# Patient Record
Sex: Male | Born: 1975 | Race: Black or African American | Hispanic: No | Marital: Single | Smoking: Former smoker
Health system: Southern US, Community
[De-identification: ages and names within clinical notes are randomized; demographics above are authoritative.]

## PROBLEM LIST (undated history)

## (undated) DIAGNOSIS — F329 Major depressive disorder, single episode, unspecified: Secondary | ICD-10-CM

## (undated) DIAGNOSIS — F32A Depression, unspecified: Secondary | ICD-10-CM

## (undated) DIAGNOSIS — I10 Essential (primary) hypertension: Secondary | ICD-10-CM

## (undated) HISTORY — PX: ADENOIDECTOMY: SUR15

---

## 2008-07-19 ENCOUNTER — Emergency Department (HOSPITAL_COMMUNITY): Admission: EM | Admit: 2008-07-19 | Discharge: 2008-07-19 | Payer: Self-pay | Admitting: Family Medicine

## 2012-04-01 ENCOUNTER — Emergency Department (HOSPITAL_COMMUNITY)
Admission: EM | Admit: 2012-04-01 | Discharge: 2012-04-01 | Disposition: A | Discharge status: Home / Self Care | Payer: Self-pay | Attending: Emergency Medicine | Admitting: Emergency Medicine

## 2012-04-01 ENCOUNTER — Encounter (HOSPITAL_COMMUNITY): Payer: Self-pay | Admitting: Emergency Medicine

## 2012-04-01 DIAGNOSIS — Z880 Allergy status to penicillin: Secondary | ICD-10-CM | POA: Insufficient documentation

## 2012-04-01 DIAGNOSIS — J329 Chronic sinusitis, unspecified: Secondary | ICD-10-CM | POA: Insufficient documentation

## 2012-04-01 DIAGNOSIS — Z91013 Allergy to seafood: Secondary | ICD-10-CM | POA: Insufficient documentation

## 2012-04-01 DIAGNOSIS — F17200 Nicotine dependence, unspecified, uncomplicated: Secondary | ICD-10-CM | POA: Insufficient documentation

## 2012-04-01 DIAGNOSIS — I10 Essential (primary) hypertension: Secondary | ICD-10-CM | POA: Insufficient documentation

## 2012-04-01 HISTORY — DX: Essential (primary) hypertension: I10

## 2012-04-01 MED ORDER — PREDNISONE 10 MG PO TABS: 20.0000 mg | ORAL_TABLET | Freq: Every day | ORAL | Status: DC

## 2012-04-01 MED ORDER — SULFAMETHOXAZOLE-TRIMETHOPRIM 800-160 MG PO TABS: 1.0000 | ORAL_TABLET | Freq: Two times a day (BID) | ORAL | Status: DC

## 2012-04-01 NOTE — ED Provider Notes (Signed)
History  Scribed for Tyler Lennert, MD, the patient was seen in room TR11C/TR11C. This chart was scribed by Candelaria Stagers. The patient's care started at 5:30 PM   CSN: [id number]  Arrival date & time 04/01/12  1547   First MD Initiated Contact with Patient 04/01/12 1727      Chief Complaint  Patient presents with  . Sinusitis     Patient is a 36 y.o. male presenting with sinusitis. The history is provided by the patient. No language interpreter was used.  Sinusitis  This is a recurrent problem. The current episode started more than 2 days ago. The problem has not changed since onset.There has been no fever. The pain is mild. Associated symptoms include congestion and sinus pressure. Pertinent negatives include no cough. He has tried nothing for the symptoms. The treatment provided no relief.   [name] is a 36 y.o. male who presents to the Emergency Department complaining of sinus pain and congestion that started several days ago.  He is also experiencing drainage and chills.  He denies cough.  Nothing seems to make the sx better or worse.   Past Medical History  Diagnosis Date  . Hypertension     monitoring- no meds    Past Surgical History  Procedure Date  . Adenoidectomy     History reviewed. No pertinent family history.  History  Substance Use Topics  . Smoking status: Current Every Day Smoker -- 0.2 packs/day    Types: Cigarettes  . Smokeless tobacco: Not on file  . Alcohol Use: Yes     occasion      Review of Systems  Constitutional: Negative for fatigue.  HENT: Positive for congestion and sinus pressure. Negative for ear discharge.   Eyes: Negative for discharge.  Respiratory: Negative for cough.   Cardiovascular: Negative for chest pain.  Gastrointestinal: Negative for abdominal pain and diarrhea.  Genitourinary: Negative for frequency and hematuria.  Musculoskeletal: Negative for back pain.  Skin: Negative for rash.  Neurological: Negative for  seizures and headaches.  Hematological: Negative.   Psychiatric/Behavioral: Negative for hallucinations.    Allergies  Penicillins and Shellfish allergy  Home Medications   Current Outpatient Rx  Name Route Sig Dispense Refill  . ALBUTEROL SULFATE HFA 108 (90 BASE) MCG/ACT IN AERS Inhalation Inhale 2 puffs into the lungs every 6 (six) hours as needed. For wheezing    . ALPRAZOLAM 0.25 MG PO TABS Oral Take 0.25 mg by mouth daily as needed. For anxiety    . ADULT MULTIVITAMIN W/MINERALS CH Oral Take 1 tablet by mouth daily.    . TETRAHYDROZOLINE HCL 0.05 % OP SOLN Both Eyes Place 1 drop into both eyes daily as needed. For dry eyes      BP 102/62  Pulse 98  Temp 98.4 F (36.9 C) (Oral)  Resp 18  SpO2 96%  Physical Exam  Constitutional: He is oriented to person, place, and time. He appears well-developed.  HENT:  Head: Normocephalic.       Minor left side maxillary tenderness  Eyes: Conjunctivae normal are normal.  Neck: No tracheal deviation present.  Cardiovascular:  No murmur heard. Musculoskeletal: Normal range of motion.  Neurological: He is oriented to person, place, and time.  Skin: Skin is warm.  Psychiatric: He has a normal mood and affect.    ED Course  Procedures   DIAGNOSTIC STUDIES: Oxygen Saturation is 96% on room air, normal by my interpretation.    COORDINATION OF CARE:  Labs Reviewed - No data to display No results found.   No diagnosis found.    MDM  The chart was scribed for me under my direct supervision.  I personally performed the history, physical, and medical decision making and all procedures in the evaluation of this patient.Tyler Lennert, MD 04/01/12 1739

## 2012-04-01 NOTE — ED Notes (Addendum)
Reports having sinus infection X 1 week; also would like to get checked for STD d/t g/f was dx recently with thinks "BV"

## 2013-04-07 ENCOUNTER — Emergency Department (HOSPITAL_COMMUNITY)
Admission: EM | Admit: 2013-04-07 | Discharge: 2013-04-07 | Disposition: A | Discharge status: Home / Self Care | Payer: Self-pay | Attending: Emergency Medicine | Admitting: Emergency Medicine

## 2013-04-07 ENCOUNTER — Encounter (HOSPITAL_COMMUNITY): Payer: Self-pay | Admitting: Emergency Medicine

## 2013-04-07 ENCOUNTER — Emergency Department (HOSPITAL_COMMUNITY): Payer: Self-pay

## 2013-04-07 DIAGNOSIS — Z79899 Other long term (current) drug therapy: Secondary | ICD-10-CM | POA: Insufficient documentation

## 2013-04-07 DIAGNOSIS — F329 Major depressive disorder, single episode, unspecified: Secondary | ICD-10-CM | POA: Insufficient documentation

## 2013-04-07 DIAGNOSIS — Z87891 Personal history of nicotine dependence: Secondary | ICD-10-CM | POA: Insufficient documentation

## 2013-04-07 DIAGNOSIS — I10 Essential (primary) hypertension: Secondary | ICD-10-CM | POA: Insufficient documentation

## 2013-04-07 DIAGNOSIS — J40 Bronchitis, not specified as acute or chronic: Secondary | ICD-10-CM

## 2013-04-07 DIAGNOSIS — J209 Acute bronchitis, unspecified: Secondary | ICD-10-CM | POA: Insufficient documentation

## 2013-04-07 DIAGNOSIS — J3489 Other specified disorders of nose and nasal sinuses: Secondary | ICD-10-CM | POA: Insufficient documentation

## 2013-04-07 DIAGNOSIS — F3289 Other specified depressive episodes: Secondary | ICD-10-CM | POA: Insufficient documentation

## 2013-04-07 DIAGNOSIS — Z880 Allergy status to penicillin: Secondary | ICD-10-CM | POA: Insufficient documentation

## 2013-04-07 HISTORY — DX: Major depressive disorder, single episode, unspecified: F32.9

## 2013-04-07 HISTORY — DX: Depression, unspecified: F32.A

## 2013-04-07 MED ORDER — PREDNISONE 20 MG PO TABS: 20.0000 mg | ORAL_TABLET | Freq: Two times a day (BID) | ORAL | Status: DC

## 2013-04-07 MED ORDER — AEROCHAMBER PLUS W/MASK MISC: Status: DC

## 2013-04-07 MED ORDER — ALBUTEROL SULFATE (5 MG/ML) 0.5% IN NEBU
5.0000 mg | INHALATION_SOLUTION | Freq: Once | RESPIRATORY_TRACT | Status: AC
  Administered 2013-04-07: 5 mg via RESPIRATORY_TRACT
  Filled 2013-04-07: qty 1

## 2013-04-07 MED ORDER — ALBUTEROL SULFATE HFA 108 (90 BASE) MCG/ACT IN AERS: 2.0000 | INHALATION_SPRAY | RESPIRATORY_TRACT | Status: AC | PRN

## 2013-04-07 MED ORDER — IPRATROPIUM BROMIDE 0.02 % IN SOLN
0.5000 mg | Freq: Once | RESPIRATORY_TRACT | Status: AC
  Administered 2013-04-07: 0.5 mg via RESPIRATORY_TRACT
  Filled 2013-04-07: qty 2.5

## 2013-04-07 NOTE — ED Notes (Signed)
Also reports sinus pain.

## 2013-04-07 NOTE — ED Notes (Signed)
Pt reporting central CP for 2 days. Has cough today and nasal congestion for 4 days. Reports coughing up gray mucus. States SOB. Skin is warm and dry. Pt is a x 4. In NAD. No cardiac hx.

## 2013-04-07 NOTE — ED Provider Notes (Signed)
CSN: [id number]     Arrival date & time 04/07/13  7829 History   None    Chief Complaint  Patient presents with  . Chest Pain   (Consider location/radiation/quality/duration/timing/severity/associated sxs/prior Treatment) HPI Comments: Tyler Bishop is a 37 y.o. male who complains of intermittent sharp left anterior chest pain, for 2 days, associated with chest congestion, and nasal congestion. He denies fever, chills, nausea, vomiting, weakness, dizziness, diaphoresis, persistent, chest pain, back pain, or near syncope. He stopped smoking one and half weeks ago. He has a mild, nonproductive cough at this time. He has a history of asthma. He tried using his daughter's nebulizer and it helped. There are no other known modifying factors.  The history is provided by the patient.    Past Medical History  Diagnosis Date  . Hypertension     monitoring- no meds  . Depression    Past Surgical History  Procedure Laterality Date  . Adenoidectomy     No family history on file. History  Substance Use Topics  . Smoking status: Former Smoker -- 0.20 packs/day    Types: Cigarettes  . Smokeless tobacco: Not on file  . Alcohol Use: No     Comment: occasion    Review of Systems  All other systems reviewed and are negative.    Allergies  Penicillins and Shellfish allergy  Home Medications   Current Outpatient Rx  Name  Route  Sig  Dispense  Refill  . ALPRAZolam (XANAX) 0.25 MG tablet   Oral   Take 0.25 mg by mouth daily as needed for anxiety. For anxiety         . predniSONE (DELTASONE) 10 MG tablet   Oral   Take 2 tablets (20 mg total) by mouth daily.   15 tablet   0   . tetrahydrozoline 0.05 % ophthalmic solution   Both Eyes   Place 1 drop into both eyes daily as needed. For dry eyes         . albuterol (PROVENTIL HFA;VENTOLIN HFA) 108 (90 BASE) MCG/ACT inhaler   Inhalation   Inhale 2 puffs into the lungs every 2 (two) hours as needed for wheezing or shortness of  breath (cough).   1 Inhaler   0   . predniSONE (DELTASONE) 20 MG tablet   Oral   Take 1 tablet (20 mg total) by mouth 2 (two) times daily.   10 tablet   0   . Spacer/Aero-Holding Chambers (AEROCHAMBER PLUS WITH MASK) inhaler      Use as instructed   1 each   2    BP 118/72  Pulse 91  Temp(Src) 98.3 F (36.8 C) (Oral)  Resp 16  Ht 5\' 5"  (1.651 m)  Wt 215 lb (97.523 kg)  BMI 35.78 kg/m2  SpO2 98% Physical Exam  Nursing note and vitals reviewed. Constitutional: He is oriented to person, place, and time. He appears well-developed and well-nourished.  HENT:  Head: Normocephalic and atraumatic.  Right Ear: External ear normal.  Left Ear: External ear normal.  Eyes: Conjunctivae and EOM are normal. Pupils are equal, round, and reactive to light.  Neck: Normal range of motion and phonation normal. Neck supple.  Cardiovascular: Normal rate, regular rhythm, normal heart sounds and intact distal pulses.   Pulmonary/Chest: Effort normal and breath sounds normal. No respiratory distress. He has no wheezes. He has no rales. He exhibits no bony tenderness.  Abdominal: Soft. Normal appearance. There is no tenderness.  Musculoskeletal: Normal range of motion.  Neurological: He is alert and oriented to person, place, and time. No cranial nerve deficit or sensory deficit. He exhibits normal muscle tone. Coordination normal.  Skin: Skin is warm, dry and intact.  Psychiatric: He has a normal mood and affect. His behavior is normal. Judgment and thought content normal.    ED Course  Procedures (including critical care time) Medications  albuterol (PROVENTIL) (5 MG/ML) 0.5% nebulizer solution 5 mg (5 mg Nebulization Given 04/07/13 1158)  ipratropium (ATROVENT) nebulizer solution 0.5 mg (0.5 mg Nebulization Given 04/07/13 1147)    Patient Vitals for the past 24 hrs:  BP Temp Temp src Pulse Resp SpO2 Height Weight  04/07/13 1006 118/72 mmHg 98.3 F (36.8 C) Oral 91 16 98 % 5\' 5"  (1.651  m) 215 lb (97.523 kg)      Labs Review Labs Reviewed - No data to display Imaging Review Dg Chest 2 View  04/07/2013   CLINICAL DATA:  Cough and chest pain  EXAM: CHEST  2 VIEW  COMPARISON:  None.  FINDINGS: Lungs are clear. Heart size and pulmonary vascularity are normal. No adenopathy. No pneumothorax. No bone lesions.  IMPRESSION: No abnormality noted.   Electronically Signed   By: Bretta Bang M.D.   On: 04/07/2013 10:45    EKG Interpretation     Ventricular Rate:  92 PR Interval:  146 QRS Duration: 88 QT Interval:  346 QTC Calculation: 427 R Axis:   78 Text Interpretation:  Normal sinus rhythm with sinus arrhythmia Normal ECG No old tracing to compare           12:46 PM Reevaluation with update and discussion. After initial assessment and treatment, an updated evaluation reveals he feels like he can't breathe and better, now.Mancel Bale L    MDM   1. Bronchitis      Evaluation consistent with bronchitis, etiology not clear. Differential diagnosis for causes include tobacco abuse seasonal allergy and reactive airway disease due to a viral illness. Is for pneumonia, ACS, PE, or serious bacterial infection. He is stable for discharge with outpatient management.  Nursing Notes Reviewed/ Care Coordinated, and agree without changes. Applicable Imaging Reviewed.  Interpretation of Laboratory Data incorporated into ED treatment   Plan: Home Medications- Albuterol, Prednisone; Home Treatments and Observation- Avoid tobacco; return here if the recommended treatment, does not improve the symptoms; Recommended follow up- PCP prn      Flint Melter, MD 04/07/13 1254

## 2013-04-07 NOTE — ED Notes (Signed)
Called once for room

## 2013-07-10 ENCOUNTER — Encounter (HOSPITAL_COMMUNITY): Payer: Self-pay | Admitting: Emergency Medicine

## 2013-07-10 ENCOUNTER — Emergency Department (HOSPITAL_COMMUNITY): Payer: Self-pay

## 2013-07-10 ENCOUNTER — Emergency Department (HOSPITAL_COMMUNITY)
Admission: EM | Admit: 2013-07-10 | Discharge: 2013-07-10 | Disposition: A | Discharge status: Home / Self Care | Payer: Self-pay | Attending: Emergency Medicine | Admitting: Emergency Medicine

## 2013-07-10 DIAGNOSIS — Z87891 Personal history of nicotine dependence: Secondary | ICD-10-CM | POA: Insufficient documentation

## 2013-07-10 DIAGNOSIS — Y939 Activity, unspecified: Secondary | ICD-10-CM | POA: Insufficient documentation

## 2013-07-10 DIAGNOSIS — Z880 Allergy status to penicillin: Secondary | ICD-10-CM | POA: Insufficient documentation

## 2013-07-10 DIAGNOSIS — F329 Major depressive disorder, single episode, unspecified: Secondary | ICD-10-CM | POA: Insufficient documentation

## 2013-07-10 DIAGNOSIS — S90129A Contusion of unspecified lesser toe(s) without damage to nail, initial encounter: Secondary | ICD-10-CM | POA: Insufficient documentation

## 2013-07-10 DIAGNOSIS — F3289 Other specified depressive episodes: Secondary | ICD-10-CM | POA: Insufficient documentation

## 2013-07-10 DIAGNOSIS — S90122A Contusion of left lesser toe(s) without damage to nail, initial encounter: Secondary | ICD-10-CM

## 2013-07-10 DIAGNOSIS — Y929 Unspecified place or not applicable: Secondary | ICD-10-CM | POA: Insufficient documentation

## 2013-07-10 DIAGNOSIS — IMO0002 Reserved for concepts with insufficient information to code with codable children: Secondary | ICD-10-CM | POA: Insufficient documentation

## 2013-07-10 DIAGNOSIS — Z79899 Other long term (current) drug therapy: Secondary | ICD-10-CM | POA: Insufficient documentation

## 2013-07-10 DIAGNOSIS — I10 Essential (primary) hypertension: Secondary | ICD-10-CM | POA: Insufficient documentation

## 2013-07-10 MED ORDER — IBUPROFEN 800 MG PO TABS: 800.0000 mg | ORAL_TABLET | Freq: Three times a day (TID) | ORAL | Status: AC | PRN

## 2013-07-10 NOTE — ED Notes (Signed)
Back from xray

## 2013-07-10 NOTE — ED Provider Notes (Signed)
CSN: [id number]     Arrival date & time 07/10/13  1320 History  This chart was scribed for non-physician practitioner, Trixie DredgeEmily Yulia Ulrich, PA-C, working with Audree CamelScott T Goldston, MD by Shari HeritageAisha Amuda, ED Scribe. This patient was seen in room TR04C/TR04C and the patient's care was started at 2:50 PM.   Chief Complaint  Patient presents with  . Toe Pain    The history is provided by the patient. No language interpreter was used.    HPI Comments: Tyler Bishop is a 38 y.o. male who presents to the Emergency Department complaining of left 5th toe pain onset 2 days ago. He rates pain as 8/10. Patient says that he slipped on some ice and hit his toe against his front steps. Patient has taken Tylenol and he also took one of his friend's Percocet tablets last night. He states that he had pain relief after taking Percocet. He denies any other pain. There is no numbness or weakness of extremities. Tetanus is UTD. He is a former smoker.  Past Medical History  Diagnosis Date  . Hypertension     monitoring- no meds  . Depression    Past Surgical History  Procedure Laterality Date  . Adenoidectomy     History reviewed. No pertinent family history. History  Substance Use Topics  . Smoking status: Former Smoker -- 0.20 packs/day    Types: Cigarettes  . Smokeless tobacco: Not on file  . Alcohol Use: No     Comment: occasion    Review of Systems  Constitutional: Negative for fever.  Musculoskeletal:       Positive for left 5th toe pain.  Neurological: Negative for weakness and numbness.    Allergies  Iodine; Penicillins; and Shellfish allergy  Home Medications   Current Outpatient Rx  Name  Route  Sig  Dispense  Refill  . albuterol (PROVENTIL HFA;VENTOLIN HFA) 108 (90 BASE) MCG/ACT inhaler   Inhalation   Inhale 2 puffs into the lungs every 2 (two) hours as needed for wheezing or shortness of breath (cough).   1 Inhaler   0   . ALPRAZolam (XANAX) 0.25 MG tablet   Oral   Take 0.25 mg by mouth  daily as needed for anxiety. For anxiety         . Multiple Vitamins-Minerals (MULTIVITAMIN GUMMIES ADULT) CHEW   Oral   Chew 1 tablet by mouth daily.         . predniSONE (DELTASONE) 20 MG tablet   Oral   Take 40 mg by mouth 2 (two) times daily with a meal.         . tetrahydrozoline 0.05 % ophthalmic solution   Both Eyes   Place 1 drop into both eyes daily as needed (dry eyes). For dry eyes          Triage Vitals: BP 119/78  Pulse 101  Temp(Src) 98.1 F (36.7 C) (Oral)  Resp 18  Wt 196 lb (88.905 kg)  SpO2 98% Physical Exam  Nursing note and vitals reviewed. Constitutional: He appears well-developed and well-nourished. No distress.  HENT:  Head: Normocephalic and atraumatic.  Neck: Neck supple.  Pulmonary/Chest: Effort normal.  Musculoskeletal:  Left 5th toe edematous, overlying ecchymosis and small abrasion. No other bony tenderness. Sensation intact. Cap refill less than 3 seconds. Able to move other digits.   Neurological: He is alert.  Skin: He is not diaphoretic.    ED Course  Procedures (including critical care time) DIAGNOSTIC STUDIES: Oxygen Saturation is 98% on  room air, normal by my interpretation.    COORDINATION OF CARE: 2:55 PM- Patient informed of current plan for treatment and evaluation and agrees with plan at this time.   Imaging Review Dg Toe 5th Left  07/10/2013   CLINICAL DATA:  38 year old male status post blunt trauma with pain and bruising. Swelling. Initial encounter.  EXAM: DG TOE 5TH LEFT  COMPARISON:  None.  FINDINGS: Left fifth phalanges appear intact. Joint spaces and alignment preserved. No acute fracture identified.  IMPRESSION: No acute fracture or dislocation identified about the left fifth toe.   Electronically Signed   By: Augusto Gamble M.D.   On: 07/10/2013 15:41     MDM   1. Contusion of toe, left    Pt with contusion of left fifth toe, small overlying abrasion.  No e/o infection.  Tetanus UTD.  Xray negative for  fracture.  D/C home with ibuprofen, RICE treatment, postop shoe.  PCP follow up.  Discussed result, findings, treatment, and follow up  with patient.  Pt given return precautions.  Pt verbalizes understanding and agrees with plan.      I personally performed the services described in this documentation, which was scribed in my presence. The recorded information has been reviewed and is accurate.    Trixie Dredge, PA-C 07/10/13 1616

## 2013-07-10 NOTE — ED Notes (Signed)
Slipped 3 days ago, catching left 5th toe on door frame. Pain continues. Bruising but no deformity.

## 2013-07-10 NOTE — Discharge Instructions (Signed)
Read the information below.  Use the prescribed medication as directed.  Please discuss all new medications with your pharmacist.  You may return to the Emergency Department at any time for worsening condition or any new symptoms that concern you.  If you develop uncontrolled pain, weakness or numbness of the extremity, severe discoloration of the skin, or you are unable to move your toe, return to the ER for a recheck.      Contusion A contusion is a deep bruise. Contusions are the result of an injury that caused bleeding under the skin. The contusion may turn blue, purple, or yellow. Minor injuries will give you a painless contusion, but more severe contusions may stay painful and swollen for a few weeks.  CAUSES  A contusion is usually caused by a blow, trauma, or direct force to an area of the body. SYMPTOMS   Swelling and redness of the injured area.  Bruising of the injured area.  Tenderness and soreness of the injured area.  Pain. DIAGNOSIS  The diagnosis can be made by taking a history and physical exam. An X-ray, CT scan, or MRI may be needed to determine if there were any associated injuries, such as fractures. TREATMENT  Specific treatment will depend on what area of the body was injured. In general, the best treatment for a contusion is resting, icing, elevating, and applying cold compresses to the injured area. Over-the-counter medicines may also be recommended for pain control. Ask your caregiver what the best treatment is for your contusion. HOME CARE INSTRUCTIONS   Put ice on the injured area.  Put ice in a plastic bag.  Place a towel between your skin and the bag.  Leave the ice on for 15-20 minutes, 03-04 times a day.  Only take over-the-counter or prescription medicines for pain, discomfort, or fever as directed by your caregiver. Your caregiver may recommend avoiding anti-inflammatory medicines (aspirin, ibuprofen, and naproxen) for 48 hours because these medicines  may increase bruising.  Rest the injured area.  If possible, elevate the injured area to reduce swelling. SEEK IMMEDIATE MEDICAL CARE IF:   You have increased bruising or swelling.  You have pain that is getting worse.  Your swelling or pain is not relieved with medicines. MAKE SURE YOU:   Understand these instructions.  Will watch your condition.  Will get help right away if you are not doing well or get worse. Document Released: 03/21/2005 Document Revised: 09/03/2011 Document Reviewed: 04/16/2011 [facility] Patient Information 2014 [employer], [place].

## 2013-07-10 NOTE — ED Notes (Signed)
Pinpoints pain to L little toe, swelling and bruising noted, no obvious deformity. Caught L toe on doorway, occurred last night. Took half of a friend's percocet last night with relief, starting to hurt more now. Denies other areas of pain, other toes effected or other sx.

## 2013-07-10 NOTE — ED Notes (Signed)
Pt states he slipped on ice and hit L 5th toe on a stair and the toe has been hurting since. Ambulatory.

## 2013-07-10 NOTE — ED Provider Notes (Signed)
Medical screening examination/treatment/procedure(s) were performed by non-physician practitioner and as supervising physician I was immediately available for consultation/collaboration.  EKG Interpretation   None         Jilliann Subramanian T Christropher Gintz, MD 07/10/13 2235 

## 2014-10-02 IMAGING — CR DG TOE 5TH 2+V*L*
3 series · 3 of 3 positions shown · non-contrast
Comparison: None.

CLINICAL DATA: 37-year-old male status post blunt trauma with pain
and bruising. Swelling. Initial encounter.

EXAM:
DG TOE 5TH LEFT

[t toes ap left]
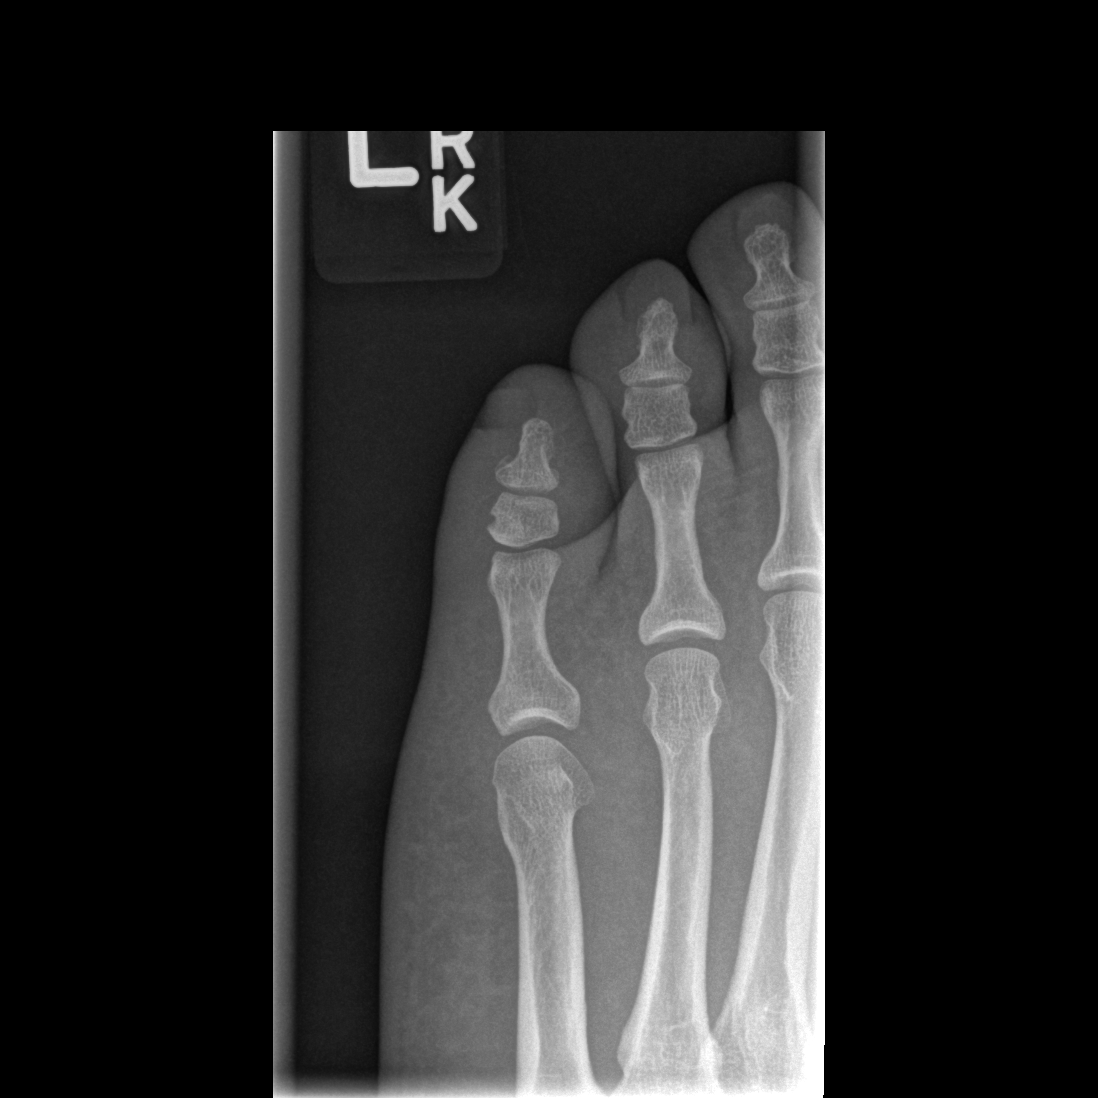

[t toes oblique left]
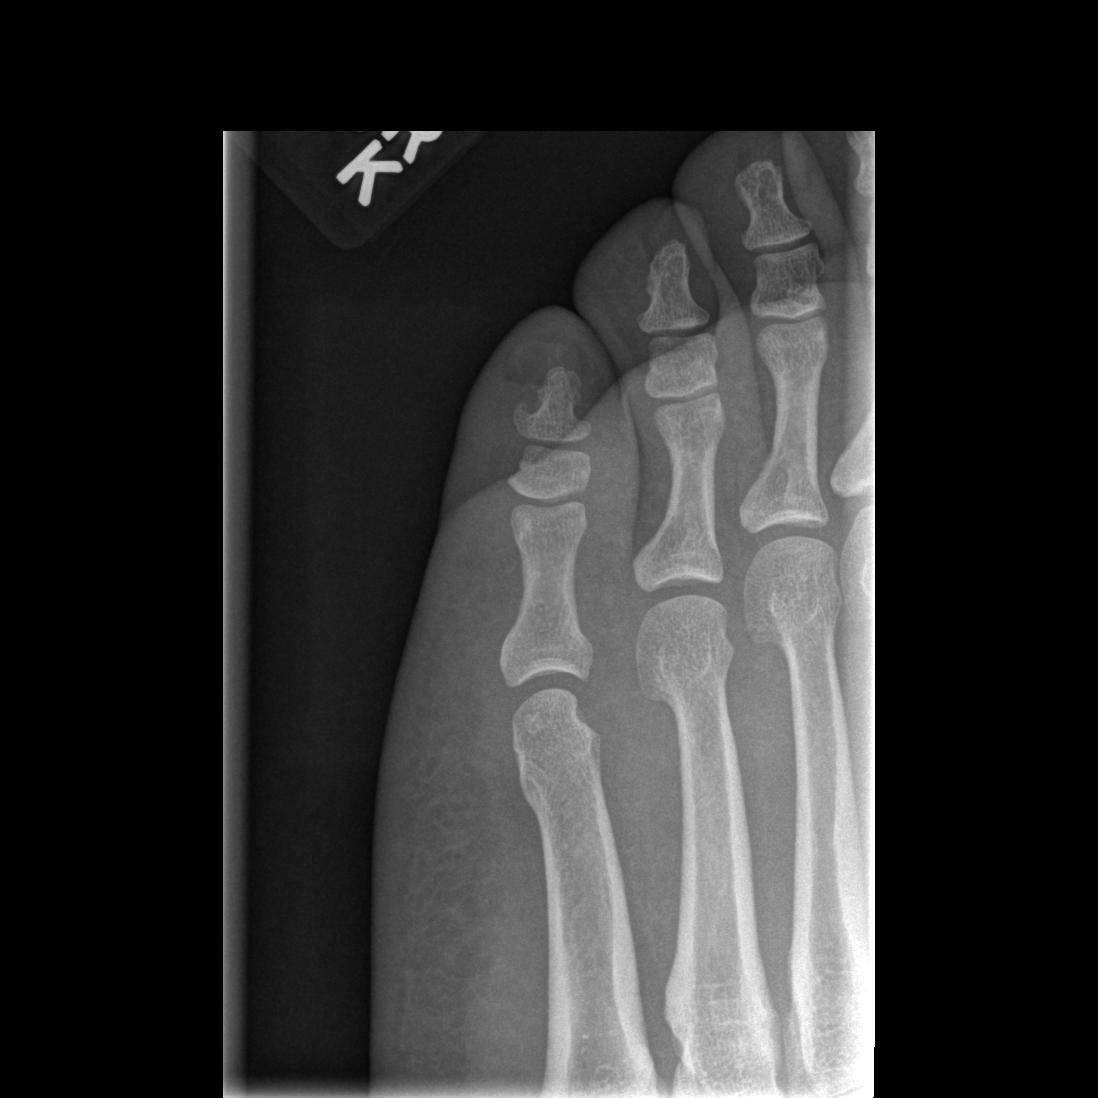

[t toes lateral left]
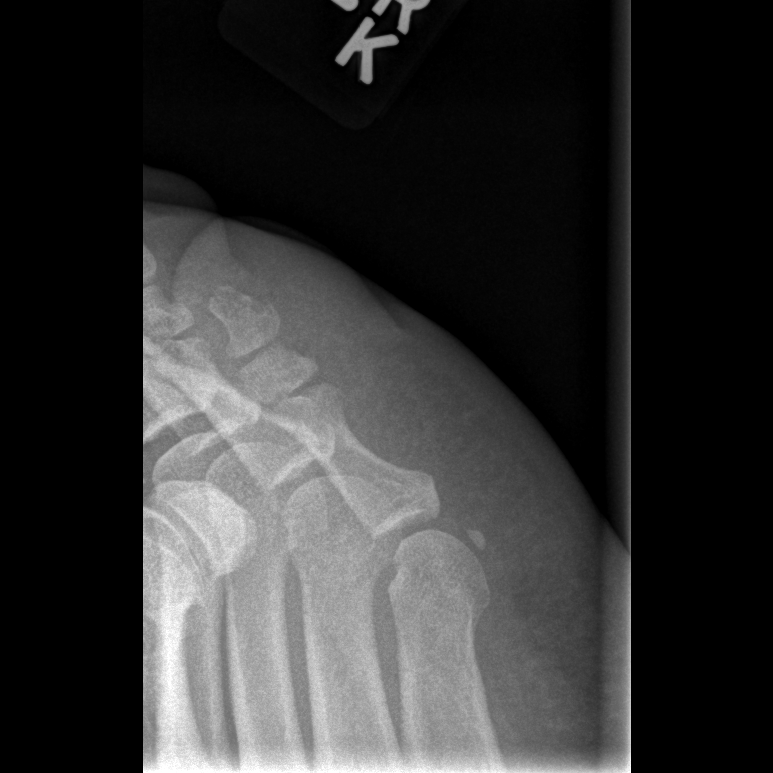

[3 of 3 positions shown; findings below may reference images not displayed]

FINDINGS: Left fifth phalanges appear intact. Joint spaces and alignment
preserved. No acute fracture identified.
IMPRESSION: No acute fracture or dislocation identified about the left fifth
toe.
